# Patient Record
Sex: Male | Born: 1985 | Race: Black or African American | Hispanic: No | Marital: Single | Smoking: Never smoker
Health system: Southern US, Community
[De-identification: ages and names within clinical notes are randomized; demographics above are authoritative.]

---

## 2007-02-20 ENCOUNTER — Emergency Department (HOSPITAL_COMMUNITY): Admission: EM | Admit: 2007-02-20 | Discharge: 2007-02-20 | Payer: Self-pay | Admitting: Obstetrics and Gynecology

## 2009-11-21 ENCOUNTER — Emergency Department (HOSPITAL_COMMUNITY): Admission: EM | Admit: 2009-11-21 | Discharge: 2009-11-21 | Payer: Self-pay | Admitting: Family Medicine

## 2009-12-30 ENCOUNTER — Emergency Department (HOSPITAL_COMMUNITY): Admission: EM | Admit: 2009-12-30 | Discharge: 2009-12-30 | Payer: Self-pay | Admitting: Emergency Medicine

## 2010-02-17 ENCOUNTER — Emergency Department (HOSPITAL_COMMUNITY): Admission: EM | Admit: 2010-02-17 | Discharge: 2010-02-17 | Payer: Self-pay | Admitting: Family Medicine

## 2010-09-05 LAB — POCT RAPID STREP A (OFFICE): Streptococcus, Group A Screen (Direct): NEGATIVE

## 2017-01-24 ENCOUNTER — Emergency Department (HOSPITAL_COMMUNITY): Payer: BLUE CROSS/BLUE SHIELD

## 2017-01-24 ENCOUNTER — Emergency Department (HOSPITAL_COMMUNITY)
Admission: EM | Admit: 2017-01-24 | Discharge: 2017-01-25 | Disposition: A | Discharge status: Home / Self Care | Payer: BLUE CROSS/BLUE SHIELD | Attending: Emergency Medicine | Admitting: Emergency Medicine

## 2017-01-24 ENCOUNTER — Encounter (HOSPITAL_COMMUNITY): Payer: Self-pay

## 2017-01-24 DIAGNOSIS — Y999 Unspecified external cause status: Secondary | ICD-10-CM | POA: Diagnosis not present

## 2017-01-24 DIAGNOSIS — Y929 Unspecified place or not applicable: Secondary | ICD-10-CM | POA: Diagnosis not present

## 2017-01-24 DIAGNOSIS — S42022A Displaced fracture of shaft of left clavicle, initial encounter for closed fracture: Secondary | ICD-10-CM | POA: Diagnosis not present

## 2017-01-24 DIAGNOSIS — T07XXXA Unspecified multiple injuries, initial encounter: Secondary | ICD-10-CM

## 2017-01-24 DIAGNOSIS — Z23 Encounter for immunization: Secondary | ICD-10-CM | POA: Insufficient documentation

## 2017-01-24 DIAGNOSIS — Y9389 Activity, other specified: Secondary | ICD-10-CM | POA: Insufficient documentation

## 2017-01-24 DIAGNOSIS — S4992XA Unspecified injury of left shoulder and upper arm, initial encounter: Secondary | ICD-10-CM | POA: Diagnosis present

## 2017-01-24 LAB — I-STAT CHEM 8, ED
BUN: 20 mg/dL (ref 6–20)
CREATININE: 0.9 mg/dL (ref 0.61–1.24)
Calcium, Ion: 0.92 mmol/L — ABNORMAL LOW (ref 1.15–1.40)
Chloride: 112 mmol/L — ABNORMAL HIGH (ref 101–111)
Glucose, Bld: 91 mg/dL (ref 65–99)
HEMATOCRIT: 33 % — AB (ref 39.0–52.0)
HEMOGLOBIN: 11.2 g/dL — AB (ref 13.0–17.0)
Potassium: 3.9 mmol/L (ref 3.5–5.1)
Sodium: 144 mmol/L (ref 135–145)
TCO2: 19 mmol/L (ref 0–100)

## 2017-01-24 MED ORDER — ONDANSETRON HCL 4 MG/2ML IJ SOLN
4.0000 mg | Freq: Once | INTRAMUSCULAR | Status: AC
  Administered 2017-01-24: 4 mg via INTRAVENOUS
  Filled 2017-01-24: qty 2

## 2017-01-24 MED ORDER — MORPHINE SULFATE (PF) 4 MG/ML IV SOLN
4.0000 mg | Freq: Once | INTRAVENOUS | Status: AC
  Administered 2017-01-24: 4 mg via INTRAVENOUS
  Filled 2017-01-24: qty 1

## 2017-01-24 MED ORDER — TETANUS-DIPHTH-ACELL PERTUSSIS 5-2.5-18.5 LF-MCG/0.5 IM SUSP
0.5000 mL | Freq: Once | INTRAMUSCULAR | Status: AC
  Administered 2017-01-24: 0.5 mL via INTRAMUSCULAR
  Filled 2017-01-24: qty 0.5

## 2017-01-24 MED ORDER — KETOROLAC TROMETHAMINE 30 MG/ML IJ SOLN
30.0000 mg | Freq: Once | INTRAMUSCULAR | Status: AC
  Administered 2017-01-24: 30 mg via INTRAVENOUS
  Filled 2017-01-24: qty 1
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
## 2017-01-24 NOTE — ED Notes (Signed)
Patient transported to CT 

## 2017-01-24 NOTE — ED Notes (Signed)
Patient transported to X-ray 
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
## 2017-01-25 MED ORDER — OXYCODONE-ACETAMINOPHEN 5-325 MG PO TABS
1.0000 | ORAL_TABLET | Freq: Once | ORAL | Status: AC | PRN
  Administered 2017-01-25: 1 via ORAL
  Filled 2017-01-25: qty 1

## 2017-01-25 MED ORDER — IBUPROFEN 600 MG PO TABS: 600.0000 mg | ORAL_TABLET | Freq: Four times a day (QID) | ORAL | 0 refills | Status: AC | PRN

## 2017-01-25 MED ORDER — OXYCODONE-ACETAMINOPHEN 5-325 MG PO TABS: 1.0000 | ORAL_TABLET | Freq: Four times a day (QID) | ORAL | 0 refills | Status: AC | PRN

## 2017-01-25 NOTE — Discharge Instructions (Signed)
Ice areas of pain/injury to limit swelling. We also recommend continuous use of a shoulder sling/immobilizer until you're able to follow-up with an orthopedist. Call the office of Dr. August Saucerean in the morning to schedule close follow-up to ensure proper healing of your broken bone. Take ibuprofen as prescribed for pain. You may supplement this with Percocet, as needed. Do not drive or drink alcohol after taking Percocet as this medication may make you drowsy and impair your judgment. You may return to the emergency department for new or concerning symptoms.

## 2017-01-27 ENCOUNTER — Ambulatory Visit (INDEPENDENT_AMBULATORY_CARE_PROVIDER_SITE_OTHER): Payer: BLUE CROSS/BLUE SHIELD | Admitting: Orthopedic Surgery

## 2017-01-27 ENCOUNTER — Encounter (INDEPENDENT_AMBULATORY_CARE_PROVIDER_SITE_OTHER): Payer: Self-pay | Admitting: Orthopedic Surgery

## 2017-01-27 DIAGNOSIS — S42002A Fracture of unspecified part of left clavicle, initial encounter for closed fracture: Secondary | ICD-10-CM

## 2017-01-27 DIAGNOSIS — M25512 Pain in left shoulder: Secondary | ICD-10-CM

## 2017-01-27 MED ORDER — OXYCODONE HCL 5 MG PO TABS
5.0000 mg | ORAL_TABLET | ORAL | 0 refills | Status: AC | PRN
Start: 2017-01-27 — End: ?
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
## 2017-02-15 ENCOUNTER — Encounter (INDEPENDENT_AMBULATORY_CARE_PROVIDER_SITE_OTHER): Payer: Self-pay | Admitting: Orthopedic Surgery

## 2017-02-15 ENCOUNTER — Ambulatory Visit (INDEPENDENT_AMBULATORY_CARE_PROVIDER_SITE_OTHER): Payer: BLUE CROSS/BLUE SHIELD | Admitting: Orthopedic Surgery

## 2017-02-15 ENCOUNTER — Ambulatory Visit (INDEPENDENT_AMBULATORY_CARE_PROVIDER_SITE_OTHER): Payer: BLUE CROSS/BLUE SHIELD

## 2017-02-15 DIAGNOSIS — S42022D Displaced fracture of shaft of left clavicle, subsequent encounter for fracture with routine healing: Secondary | ICD-10-CM
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
## 2017-03-08 ENCOUNTER — Encounter (INDEPENDENT_AMBULATORY_CARE_PROVIDER_SITE_OTHER): Payer: Self-pay | Admitting: Orthopedic Surgery

## 2017-03-08 ENCOUNTER — Ambulatory Visit (INDEPENDENT_AMBULATORY_CARE_PROVIDER_SITE_OTHER): Payer: BLUE CROSS/BLUE SHIELD

## 2017-03-08 ENCOUNTER — Ambulatory Visit (INDEPENDENT_AMBULATORY_CARE_PROVIDER_SITE_OTHER): Payer: BLUE CROSS/BLUE SHIELD | Admitting: Orthopedic Surgery

## 2017-03-08 DIAGNOSIS — S42002D Fracture of unspecified part of left clavicle, subsequent encounter for fracture with routine healing: Secondary | ICD-10-CM
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
## 2020-06-06 ENCOUNTER — Ambulatory Visit (HOSPITAL_COMMUNITY)
Admission: EM | Admit: 2020-06-06 | Discharge: 2020-06-06 | Disposition: A | Discharge status: Home / Self Care | Payer: BC Managed Care – PPO | Attending: Family Medicine | Admitting: Family Medicine

## 2020-06-06 ENCOUNTER — Encounter (HOSPITAL_COMMUNITY): Payer: Self-pay

## 2020-06-06 ENCOUNTER — Other Ambulatory Visit: Payer: Self-pay

## 2020-06-06 DIAGNOSIS — Z20822 Contact with and (suspected) exposure to covid-19: Secondary | ICD-10-CM | POA: Insufficient documentation

## 2020-06-06 DIAGNOSIS — R509 Fever, unspecified: Secondary | ICD-10-CM | POA: Insufficient documentation

## 2020-06-06 NOTE — Discharge Instructions (Signed)
Please use ibuprofen and tylenol for fever  Please stay well hydrated  We will call if positive  Please follow if your symptoms fail to improve.

## 2020-06-06 NOTE — ED Triage Notes (Signed)
Pt presents with fever, generalized body aches, non productive cough, congestion, and vomiting X 2 days.

## 2020-06-06 NOTE — ED Provider Notes (Signed)
[facility]    CSN: [id number] Arrival date & time: 06/06/20  1930      History   Chief Complaint Chief Complaint  Patient presents with  . Fever  . Cough  . Generalized Body Aches    HPI Michael Pratt is a 34 y.o. male.   He is presenting with fever, malaise and body aches.  His symptoms been ongoing for a couple of days.  Denies any possible exposure to anyone with Covid.  Has not been vaccinated against Covid.  HPI  History reviewed. No pertinent past medical history.  There are no problems to display for this patient.   History reviewed. No pertinent surgical history.     Home Medications    Prior to Admission medications   Medication Sig Start Date End Date Taking? Authorizing Provider  ibuprofen (ADVIL,MOTRIN) 600 MG tablet Take 1 tablet (600 mg total) by mouth every 6 (six) hours as needed. 01/25/17   Antony Madura, PA-C  oxyCODONE (OXY IR/ROXICODONE) 5 MG immediate release tablet Take 1 tablet (5 mg total) by mouth every 4 (four) hours as needed for severe pain. 01/27/17   Cammy Copa, MD  oxyCODONE-acetaminophen (PERCOCET/ROXICET) 5-325 MG tablet Take 1-2 tablets by mouth every 6 (six) hours as needed for severe pain. 01/25/17   Antony Madura, PA-C    Family History History reviewed. No pertinent family history.  Social History Social History   Tobacco Use  . Smoking status: Never Smoker  . Smokeless tobacco: Never Used  Vaping Use  . Vaping Use: Every day  Substance Use Topics  . Alcohol use: Yes    Comment: weekends  . Drug use: No     Allergies   Patient has no known allergies.   Review of Systems Review of Systems  See HPI  Physical Exam Triage Vital Signs ED Triage Vitals  Enc Vitals Group     BP 06/06/20 2022 117/77     Pulse Rate 06/06/20 2022 97     Resp 06/06/20 2022 16     Temp 06/06/20 2022 (!) 100.5 F (38.1 C)     Temp Source 06/06/20 2022 Oral     SpO2 06/06/20 2022 97 %     Weight --      Height --       Head Circumference --      Peak Flow --      Pain Score 06/06/20 2030 6     Pain Loc --      Pain Edu? --      Excl. in GC? --    No data found.  Updated Vital Signs BP 117/77 (BP Location: Left Arm)   Pulse 97   Temp (!) 100.5 F (38.1 C) (Oral)   Resp 16   SpO2 97%   Visual Acuity Right Eye Distance:   Left Eye Distance:   Bilateral Distance:    Right Eye Near:   Left Eye Near:    Bilateral Near:     Physical Exam Gen: NAD, alert, cooperative with exam,  ENT: normal lips, normal nasal mucosa,  Eye: normal EOM, normal conjunctiva and lids CV: Regular rate and rhythm, S1-S2 Resp: no accessory muscle use, non-labored,  Skin: no rashes, no areas of induration  Neuro: normal tone, normal sensation to touch Psych:  normal insight, alert and oriented    UC Treatments / Results  Labs (all labs ordered are listed, but only abnormal results are displayed) Labs Reviewed  SARS CORONAVIRUS 2 (TAT  6-24 HRS)    EKG   Radiology No results found.  Procedures Procedures (including critical care time)  Medications Ordered in UC Medications - No data to display  Initial Impression / Assessment and Plan / UC Course  I have reviewed the triage vital signs and the nursing notes.  Pertinent labs & imaging results that were available during my care of the patient were reviewed by me and considered in my medical decision making (see chart for details).     [name] is a 34 year old male that is presenting with fever and body aches.  Possible for Covid infection.  Will obtain a swab.  Counseled on supportive care.  Given indications on follow-up.  Final Clinical Impressions(s) / UC Diagnoses   Final diagnoses:  Fever, unspecified fever cause     Discharge Instructions     Please use ibuprofen and tylenol for fever  Please stay well hydrated  We will call if positive  Please follow if your symptoms fail to improve.     ED Prescriptions    None      PDMP not reviewed this encounter.   Myra Rude, MD 06/06/20 2102

## 2020-06-07 LAB — SARS CORONAVIRUS 2 (TAT 6-24 HRS): SARS Coronavirus 2: NEGATIVE
